# Patient Record
Sex: Female | Born: 1960 | Race: White | Hispanic: No | Marital: Married | Smoking: Never smoker
Health system: Southern US, Community
[De-identification: ages and names within clinical notes are randomized; demographics above are authoritative.]

## PROBLEM LIST (undated history)

## (undated) DIAGNOSIS — J189 Pneumonia, unspecified organism: Secondary | ICD-10-CM

## (undated) DIAGNOSIS — E042 Nontoxic multinodular goiter: Secondary | ICD-10-CM

## (undated) DIAGNOSIS — Z8719 Personal history of other diseases of the digestive system: Secondary | ICD-10-CM

## (undated) DIAGNOSIS — Z9889 Other specified postprocedural states: Secondary | ICD-10-CM

## (undated) DIAGNOSIS — G4730 Sleep apnea, unspecified: Secondary | ICD-10-CM

## (undated) DIAGNOSIS — K219 Gastro-esophageal reflux disease without esophagitis: Secondary | ICD-10-CM

## (undated) HISTORY — PX: BIOPSY THYROID: PRO38

---
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
## 1997-11-12 DIAGNOSIS — I82402 Acute embolism and thrombosis of unspecified deep veins of left lower extremity: Secondary | ICD-10-CM

## 1997-11-12 HISTORY — PX: EXPLORATORY LAPAROTOMY: SUR591

## 1997-11-12 HISTORY — DX: Acute embolism and thrombosis of unspecified deep veins of left lower extremity: I82.402

## 2017-11-12 HISTORY — PX: ESOPHAGOGASTRODUODENOSCOPY: SHX1529

## 2017-11-12 HISTORY — PX: COLONOSCOPY: SHX174

## 2021-01-13 ENCOUNTER — Other Ambulatory Visit: Payer: Self-pay | Admitting: Internal Medicine

## 2021-01-13 DIAGNOSIS — G8929 Other chronic pain: Secondary | ICD-10-CM

## 2021-01-30 ENCOUNTER — Other Ambulatory Visit: Payer: Self-pay

## 2021-01-30 ENCOUNTER — Ambulatory Visit
Admission: RE | Admit: 2021-01-30 | Discharge: 2021-01-30 | Disposition: A | Discharge status: Home / Self Care | Payer: Federal, State, Local not specified - PPO | Source: Ambulatory Visit | Attending: Internal Medicine | Admitting: Internal Medicine

## 2021-01-30 DIAGNOSIS — G8929 Other chronic pain: Secondary | ICD-10-CM | POA: Insufficient documentation

## 2021-01-30 DIAGNOSIS — R109 Unspecified abdominal pain: Secondary | ICD-10-CM | POA: Insufficient documentation

## 2021-01-30 MED ORDER — IOHEXOL 300 MG/ML  SOLN
100.0000 mL | Freq: Once | INTRAMUSCULAR | Status: AC | PRN
  Administered 2021-01-30: 100 mL via INTRAVENOUS

## 2021-07-05 ENCOUNTER — Other Ambulatory Visit: Payer: Self-pay | Admitting: Internal Medicine

## 2021-07-05 DIAGNOSIS — Z1231 Encounter for screening mammogram for malignant neoplasm of breast: Secondary | ICD-10-CM

## 2021-09-04 ENCOUNTER — Ambulatory Visit
Admission: RE | Admit: 2021-09-04 | Discharge: 2021-09-04 | Disposition: A | Discharge status: Home / Self Care | Payer: Federal, State, Local not specified - PPO | Source: Ambulatory Visit | Attending: Internal Medicine | Admitting: Internal Medicine

## 2021-09-04 ENCOUNTER — Other Ambulatory Visit: Payer: Self-pay

## 2021-09-04 DIAGNOSIS — Z1231 Encounter for screening mammogram for malignant neoplasm of breast: Secondary | ICD-10-CM | POA: Insufficient documentation

## 2021-09-11 ENCOUNTER — Other Ambulatory Visit: Payer: Self-pay | Admitting: Internal Medicine

## 2021-09-11 DIAGNOSIS — R928 Other abnormal and inconclusive findings on diagnostic imaging of breast: Secondary | ICD-10-CM

## 2021-09-11 DIAGNOSIS — N6320 Unspecified lump in the left breast, unspecified quadrant: Secondary | ICD-10-CM

## 2021-09-14 ENCOUNTER — Ambulatory Visit
Admission: RE | Admit: 2021-09-14 | Discharge: 2021-09-14 | Disposition: A | Discharge status: Home / Self Care | Payer: Federal, State, Local not specified - PPO | Source: Ambulatory Visit | Attending: Internal Medicine | Admitting: Internal Medicine

## 2021-09-14 ENCOUNTER — Other Ambulatory Visit: Payer: Self-pay

## 2021-09-14 DIAGNOSIS — R928 Other abnormal and inconclusive findings on diagnostic imaging of breast: Secondary | ICD-10-CM

## 2021-09-14 DIAGNOSIS — N6320 Unspecified lump in the left breast, unspecified quadrant: Secondary | ICD-10-CM

## 2021-11-12 HISTORY — PX: ESOPHAGOGASTRODUODENOSCOPY: SHX1529

## 2021-11-23 ENCOUNTER — Ambulatory Visit: Payer: Self-pay | Admitting: General Surgery

## 2021-11-23 NOTE — H&P (Addendum)
PATIENT PROFILE: Laura Compton is a 61 y.o. 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  PCP:  Gladstone Lighter, MD  HISTORY OF PRESENT ILLNESS: Ms. Laura Compton reports she has been having right upper quadrant pain since 3 to 4 years ago.  Patient endorses that the pain is localized to the right upper quadrant.  Pain also related to the epigastric area.  Pain also radiates to the right upper back.  Pain is exacerbated by eating.  There has been no alleviating factors.  Patient endorses that it is exacerbated usually by fatty meals.  Denies any nausea or vomiting.  Denies any issues with bowel movement.  Patient has been evaluated by gastroenterology.  She has had upper endoscopy procedures done without any significant findings.  She has tried multiple antiacid and antireflux medication without any improvement of the pain.  She had an ultrasound done that does not shows cholelithiasis or any abnormal findings.  She also had a HIDA scan that shows feeling of the gallbladder with ejection fraction of 25%.  I personally evaluated the images of the ultrasound and the HIDA scan.   PROBLEM LIST: Problem List  Date Reviewed: 10/23/2021          Noted   GERD (gastroesophageal reflux disease) 11/24/2020   Hx of deep venous thrombosis 11/24/2020   Hyperlipidemia 11/24/2020   Thyroid disease 11/24/2020   Multiple thyroid nodules 11/24/2020   Right flank pain, chronic 11/24/2020   Left foot pain 11/24/2020    GENERAL REVIEW OF SYSTEMS:   General ROS: negative for - chills, fatigue, fever, weight gain or weight loss Allergy and Immunology ROS: negative for - hives  Hematological and Lymphatic ROS: negative for - bleeding problems or bruising, negative for palpable nodes Endocrine ROS: negative for - heat or cold intolerance, hair changes Respiratory ROS: negative for - cough, shortness of breath or wheezing Cardiovascular ROS: no chest  pain or palpitations GI ROS: negative for nausea, vomiting, diarrhea, constipation.  Positive for abdominal pain Musculoskeletal ROS: negative for - joint swelling or muscle pain Neurological ROS: negative for - confusion, syncope Dermatological ROS: negative for pruritus and rash Psychiatric: negative for anxiety, depression, difficulty sleeping and memory loss  MEDICATIONS: Current Outpatient Medications  Medication Sig Dispense Refill   pantoprazole (PROTONIX) 40 MG DR tablet TAKE 1 TABLET BY MOUTH EVERY DAY 90 tablet 1   betamethasone dipropionate (DIPROSONE) 0.05 % ointment Apply topically 2 (two) times daily Use for 5 to 7 days and then as needed. (Patient not taking: Reported on 11/23/2021) 15 g 0   famotidine (PEPCID) 40 MG tablet Take 1 tablet (40 mg total) by mouth at bedtime (Patient not taking: Reported on 11/23/2021) 30 tablet 11   ipratropium (ATROVENT) 0.06 % nasal spray Place 2 sprays into both nostrils 3 (three) times daily as needed for Rhinitis (Patient not taking: Reported on 11/23/2021) 15 mL 0   ondansetron (ZOFRAN-ODT) 4 MG disintegrating tablet Take 1 tablet (4 mg total) by mouth every 8 (eight) hours as needed for Nausea May take TWO at a time IF NEEDED. (Patient not taking: Reported on 11/23/2021) 20 tablet 0   promethazine-dextromethorphan (PROMETHAZINE-DM) 6.25-15 mg/5 mL syrup Take 5 mLs by mouth every 6 (six) hours as needed (Patient not taking: Reported on 11/23/2021) 120 mL 0   sucralfate (CARAFATE) 1 gram tablet  (Patient not taking: Reported on 11/23/2021)     No current facility-administered medications for this visit.  ALLERGIES: °Patient has no known allergies. ° °PAST MEDICAL HISTORY: °Past Medical History:  °Diagnosis Date  ° COVID 06/24/2021  ° GERD (gastroesophageal reflux disease)   ° Hx of deep venous thrombosis   ° Hyperlipidemia   ° Sleep apnea 2019  ° Thyroid disease   ° ° °PAST SURGICAL HISTORY: °Past Surgical History:  °Procedure Laterality Date  °  APPENDECTOMY    ° ESOPHAGOGASTRODOUDENOSCOPY W/ABLATION TUMOR/POLYP/LESION    ° PERCUTANEOUS BIOPSY THYROID    °  ° °FAMILY HISTORY: °Family History  °Problem Relation Age of Onset  ° Dementia Mother   ° Thyroid disease Mother   ° Cancer Mother   ° Alzheimer's disease Mother   ° Heart disease Father   ° Deep vein thrombosis (DVT or abnormal blood clot formation) Father   ° Other Father   °     polio  ° Breast cancer Sister   ° High blood pressure (Hypertension) Brother   ° High blood pressure (Hypertension) Brother   ° Thyroid disease Sister   ° Cancer Sister   ° Thyroid disease Sister   °  ° °SOCIAL HISTORY: °Social History  ° °Socioeconomic History  ° Marital status: Married  °Tobacco Use  ° Smoking status: Never  ° Smokeless tobacco: Never  °Vaping Use  ° Vaping Use: Never used  °Substance and Sexual Activity  ° Alcohol use: Not Currently  °  Comment: maybeonce a month  ° Drug use: Never  ° Sexual activity: Yes  °  Partners: Male  °  Birth control/protection: None  ° ° °PHYSICAL EXAM: °Vitals:  ° 11/23/21 0755  °BP: 119/78  °Pulse: 93  ° °Body mass index is 38.11 kg/m². °Weight: 100.7 kg (222 lb)  ° °GENERAL: Alert, active, oriented x3 ° °HEENT: Pupils equal reactive to light. Extraocular movements are intact. Sclera clear. Palpebral conjunctiva normal red color.Pharynx clear. ° °NECK: Supple with no palpable mass and no adenopathy. ° °LUNGS: Sound clear with no rales rhonchi or wheezes. ° °HEART: Regular rhythm S1 and S2 without murmur. ° °ABDOMEN: Soft and depressible, nontender with no palpable mass, no hepatomegaly.  ° °EXTREMITIES: Well-developed well-nourished symmetrical with no dependent edema. ° °NEUROLOGICAL: Awake alert oriented, facial expression symmetrical, moving all extremities. ° °REVIEW OF DATA: °I have reviewed the following data today: °Office Visit on 10/23/2021  °Component Date Value  ° Glucose 10/23/2021 96   ° Sodium 10/23/2021 135 (L)   ° Potassium 10/23/2021 3.3 (L)   ° Chloride  10/23/2021 95 (L)   ° Carbon Dioxide (CO2) 10/23/2021 32.3 (H)   ° Calcium 10/23/2021 8.6 (L)   ° Urea Nitrogen (BUN) 10/23/2021 15   ° Creatinine 10/23/2021 0.8   ° Glomerular Filtration Ra* 10/23/2021 73   ° BUN/Crea Ratio 10/23/2021 18.8   ° Anion Gap w/K 10/23/2021 11.0   ° WBC (White Blood Cell Co* 10/23/2021 5.6   ° RBC (Red Blood Cell Coun* 10/23/2021 4.88   ° Hemoglobin 10/23/2021 13.8   ° Hematocrit 10/23/2021 40.0   ° MCV (Mean Corpuscular Vo* 10/23/2021 82.0   ° MCH (Mean Corpuscular He* 10/23/2021 28.3   ° MCHC (Mean Corpuscular H* 10/23/2021 34.5   ° Platelet Count 10/23/2021 108 (L)   ° RDW-CV (Red Cell Distrib* 10/23/2021 12.4   ° MPV (Mean Platelet Volum* 10/23/2021 10.6   ° Neutrophils 10/23/2021 3.26   ° Lymphocytes 10/23/2021 1.81   ° Monocytes 10/23/2021 0.42   ° Eosinophils 10/23/2021 0.00   ° Basophils 10/23/2021 0.03   °   Neutrophil % 10/23/2021 58.5   ° Lymphocyte % 10/23/2021 32.4   ° Monocyte % 10/23/2021 7.5   ° Eosinophil % 10/23/2021 0.0 (L)   ° Basophil% 10/23/2021 0.5   ° Immature Granulocyte % 10/23/2021 1.1 (H)   ° Immature Granulocyte Cou* 10/23/2021 0.06   ° Influenza A Antigen 10/23/2021 Negative   ° Influenza B Antigen 10/23/2021 Negative   °Office Visit on 10/17/2021  °Component Date Value  ° Influenza A PCR 10/17/2021 Negative   ° Influenza B PCR 10/17/2021 Negative   ° RSV PCR 10/17/2021 Negative   ° SARS-CoV2 PCR 10/17/2021 Positive (!)   ° WBC (White Blood Cell Co* 10/17/2021 6.3   ° RBC (Red Blood Cell Coun* 10/17/2021 5.37   ° Hemoglobin 10/17/2021 15.3 (H)   ° Hematocrit 10/17/2021 44.8   ° MCV (Mean Corpuscular Vo* 10/17/2021 83.4   ° MCH (Mean Corpuscular He* 10/17/2021 28.5   ° MCHC (Mean Corpuscular H* 10/17/2021 34.2   ° Platelet Count 10/17/2021 175   ° RDW-CV (Red Cell Distrib* 10/17/2021 12.6   ° MPV (Mean Platelet Volum* 10/17/2021 9.8   ° Neutrophils 10/17/2021 5.10   ° Lymphocytes 10/17/2021 0.77 (L)   ° Monocytes 10/17/2021 0.34   ° Eosinophils 10/17/2021 0.06    ° Basophils 10/17/2021 0.02   ° Neutrophil % 10/17/2021 80.9 (H)   ° Lymphocyte % 10/17/2021 12.2   ° Monocyte % 10/17/2021 5.4   ° Eosinophil % 10/17/2021 1.0   ° Basophil% 10/17/2021 0.3   ° Immature Granulocyte % 10/17/2021 0.2   ° Immature Granulocyte Cou* 10/17/2021 0.01   ° Glucose 10/17/2021 112 (H)   ° Sodium 10/17/2021 134 (L)   ° Potassium 10/17/2021 3.7   ° Chloride 10/17/2021 100   ° Carbon Dioxide (CO2) 10/17/2021 27.3   ° Urea Nitrogen (BUN) 10/17/2021 10   ° Creatinine 10/17/2021 0.8   ° Glomerular Filtration Ra* 10/17/2021 73   ° Calcium 10/17/2021 9.2   ° AST  10/17/2021 26   ° ALT  10/17/2021 28   ° Alk Phos (alkaline Phosp* 10/17/2021 68   ° Albumin 10/17/2021 4.3   ° Bilirubin, Total 10/17/2021 0.8   ° Protein, Total 10/17/2021 7.4   ° A/G Ratio 10/17/2021 1.4   ° Amylase 10/17/2021 44   ° Lipase 10/17/2021 29   °  ° °ASSESSMENT: °Ms. Laura Compton is a 60 y.o. female presenting for consultation for biliary dyskinesia.   ° °Patient was oriented about the diagnosis of biliary dyskinesia. Also oriented about what is the gallbladder, its anatomy and function and the implications of gallbladder low ejection fraction. The patient was oriented about the treatment alternatives (observation vs cholecystectomy). Patient was oriented that a low percentage of patient will continue to have similar pain symptoms even after the gallbladder is removed. Surgical technique (open vs laparoscopic) was discussed. It was also discussed the goals of the surgery (decrease the pain episodes and avoid the risk of cholecystitis) and the risk of surgery including: bleeding, infection, common bile duct injury, stone retention, injury to other organs such as bowel, liver, stomach, other complications such as hernia, bowel obstruction among others. Also discussed with patient about anesthesia and its complications such as: reaction to medications, pneumonia, heart complications, death, among others.  ° °Since patient had classic  biliary colic's I considered that if cholecystectomy should benefit the patient.  She understand that there is a low risk of not improving the pain. 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  Robotic   assisted laparoscopic cholecystectomy (81275) 2.  CBC, CMP (Done) 3.  Do not take aspirin 5 days before the procedure 4.  Contact us if has any question or concern.   Patient and her husband verbalized understanding, all questions were answered, and were agreeable with the plan outlined above.    [name], MD  Electronically signed by [name], MD

## 2021-11-23 NOTE — H&P (View-Only) (Signed)
PATIENT PROFILE: Kalani Baray is a 61 y.o. 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  PCP:  Gladstone Lighter, MD  HISTORY OF PRESENT ILLNESS: Ms. Dominique reports she has been having right upper quadrant pain since 3 to 4 years ago.  Patient endorses that the pain is localized to the right upper quadrant.  Pain also related to the epigastric area.  Pain also radiates to the right upper back.  Pain is exacerbated by eating.  There has been no alleviating factors.  Patient endorses that it is exacerbated usually by fatty meals.  Denies any nausea or vomiting.  Denies any issues with bowel movement.  Patient has been evaluated by gastroenterology.  She has had upper endoscopy procedures done without any significant findings.  She has tried multiple antiacid and antireflux medication without any improvement of the pain.  She had an ultrasound done that does not shows cholelithiasis or any abnormal findings.  She also had a HIDA scan that shows feeling of the gallbladder with ejection fraction of 25%.  I personally evaluated the images of the ultrasound and the HIDA scan.   PROBLEM LIST: Problem List  Date Reviewed: 10/23/2021          Noted   GERD (gastroesophageal reflux disease) 11/24/2020   Hx of deep venous thrombosis 11/24/2020   Hyperlipidemia 11/24/2020   Thyroid disease 11/24/2020   Multiple thyroid nodules 11/24/2020   Right flank pain, chronic 11/24/2020   Left foot pain 11/24/2020    GENERAL REVIEW OF SYSTEMS:   General ROS: negative for - chills, fatigue, fever, weight gain or weight loss Allergy and Immunology ROS: negative for - hives  Hematological and Lymphatic ROS: negative for - bleeding problems or bruising, negative for palpable nodes Endocrine ROS: negative for - heat or cold intolerance, hair changes Respiratory ROS: negative for - cough, shortness of breath or wheezing Cardiovascular ROS: no chest  pain or palpitations GI ROS: negative for nausea, vomiting, diarrhea, constipation.  Positive for abdominal pain Musculoskeletal ROS: negative for - joint swelling or muscle pain Neurological ROS: negative for - confusion, syncope Dermatological ROS: negative for pruritus and rash Psychiatric: negative for anxiety, depression, difficulty sleeping and memory loss  MEDICATIONS: Current Outpatient Medications  Medication Sig Dispense Refill   pantoprazole (PROTONIX) 40 MG DR tablet TAKE 1 TABLET BY MOUTH EVERY DAY 90 tablet 1   betamethasone dipropionate (DIPROSONE) 0.05 % ointment Apply topically 2 (two) times daily Use for 5 to 7 days and then as needed. (Patient not taking: Reported on 11/23/2021) 15 g 0   famotidine (PEPCID) 40 MG tablet Take 1 tablet (40 mg total) by mouth at bedtime (Patient not taking: Reported on 11/23/2021) 30 tablet 11   ipratropium (ATROVENT) 0.06 % nasal spray Place 2 sprays into both nostrils 3 (three) times daily as needed for Rhinitis (Patient not taking: Reported on 11/23/2021) 15 mL 0   ondansetron (ZOFRAN-ODT) 4 MG disintegrating tablet Take 1 tablet (4 mg total) by mouth every 8 (eight) hours as needed for Nausea May take TWO at a time IF NEEDED. (Patient not taking: Reported on 11/23/2021) 20 tablet 0   promethazine-dextromethorphan (PROMETHAZINE-DM) 6.25-15 mg/5 mL syrup Take 5 mLs by mouth every 6 (six) hours as needed (Patient not taking: Reported on 11/23/2021) 120 mL 0   sucralfate (CARAFATE) 1 gram tablet  (Patient not taking: Reported on 11/23/2021)     No current facility-administered medications for this visit.  ALLERGIES: Patient has no known allergies.  PAST MEDICAL HISTORY: Past Medical History:  Diagnosis Date   COVID 06/24/2021   GERD (gastroesophageal reflux disease)    Hx of deep venous thrombosis    Hyperlipidemia    Sleep apnea 2019   Thyroid disease     PAST SURGICAL HISTORY: Past Surgical History:  Procedure Laterality Date    APPENDECTOMY     ESOPHAGOGASTRODOUDENOSCOPY W/ABLATION TUMOR/POLYP/LESION     PERCUTANEOUS BIOPSY THYROID       FAMILY HISTORY: Family History  Problem Relation Age of Onset   Dementia Mother    Thyroid disease Mother    Cancer Mother    Alzheimer's disease Mother    Heart disease Father    Deep vein thrombosis (DVT or abnormal blood clot formation) Father    Other Father        polio   Breast cancer Sister    High blood pressure (Hypertension) Brother    High blood pressure (Hypertension) Brother    Thyroid disease Sister    Cancer Sister    Thyroid disease Sister      SOCIAL HISTORY: Social History   Socioeconomic History   Marital status: Married  Tobacco Use   Smoking status: Never   Smokeless tobacco: Never  Vaping Use   Vaping Use: Never used  Substance and Sexual Activity   Alcohol use: Not Currently    Comment: maybeonce a month   Drug use: Never   Sexual activity: Yes    Partners: Male    Birth control/protection: None    PHYSICAL EXAM: Vitals:   11/23/21 0755  BP: 119/78  Pulse: 93   Body mass index is 38.11 kg/m. Weight: 100.7 kg (222 lb)   GENERAL: Alert, active, oriented x3  HEENT: Pupils equal reactive to light. Extraocular movements are intact. Sclera clear. Palpebral conjunctiva normal red color.Pharynx clear.  NECK: Supple with no palpable mass and no adenopathy.  LUNGS: Sound clear with no rales rhonchi or wheezes.  HEART: Regular rhythm S1 and S2 without murmur.  ABDOMEN: Soft and depressible, nontender with no palpable mass, no hepatomegaly.   EXTREMITIES: Well-developed well-nourished symmetrical with no dependent edema.  NEUROLOGICAL: Awake alert oriented, facial expression symmetrical, moving all extremities.  REVIEW OF DATA: I have reviewed the following data today: Office Visit on 10/23/2021  Component Date Value   Glucose 10/23/2021 96    Sodium 10/23/2021 135 (L)    Potassium 10/23/2021 3.3 (L)    Chloride  10/23/2021 95 (L)    Carbon Dioxide (CO2) 10/23/2021 32.3 (H)    Calcium 10/23/2021 8.6 (L)    Urea Nitrogen (BUN) 10/23/2021 15    Creatinine 10/23/2021 0.8    Glomerular Filtration Ra* 10/23/2021 73    BUN/Crea Ratio 10/23/2021 18.8    Anion Gap w/K 10/23/2021 11.0    WBC (White Blood Cell Co* 10/23/2021 5.6    RBC (Red Blood Cell Coun* 10/23/2021 4.88    Hemoglobin 10/23/2021 13.8    Hematocrit 10/23/2021 40.0    MCV (Mean Corpuscular Vo* 10/23/2021 82.0    MCH (Mean Corpuscular He* 10/23/2021 28.3    MCHC (Mean Corpuscular H* 10/23/2021 34.5    Platelet Count 10/23/2021 108 (L)    RDW-CV (Red Cell Distrib* 10/23/2021 12.4    MPV (Mean Platelet Volum* 10/23/2021 10.6    Neutrophils 10/23/2021 3.26    Lymphocytes 10/23/2021 1.81    Monocytes 10/23/2021 0.42    Eosinophils 10/23/2021 0.00    Basophils 10/23/2021 0.03  Neutrophil % 10/23/2021 58.5   ° Lymphocyte % 10/23/2021 32.4   ° Monocyte % 10/23/2021 7.5   ° Eosinophil % 10/23/2021 0.0 (L)   ° Basophil% 10/23/2021 0.5   ° Immature Granulocyte % 10/23/2021 1.1 (H)   ° Immature Granulocyte Cou* 10/23/2021 0.06   ° Influenza A Antigen 10/23/2021 Negative   ° Influenza B Antigen 10/23/2021 Negative   °Office Visit on 10/17/2021  °Component Date Value  ° Influenza A PCR 10/17/2021 Negative   ° Influenza B PCR 10/17/2021 Negative   ° RSV PCR 10/17/2021 Negative   ° SARS-CoV2 PCR 10/17/2021 Positive (!)   ° WBC (White Blood Cell Co* 10/17/2021 6.3   ° RBC (Red Blood Cell Coun* 10/17/2021 5.37   ° Hemoglobin 10/17/2021 15.3 (H)   ° Hematocrit 10/17/2021 44.8   ° MCV (Mean Corpuscular Vo* 10/17/2021 83.4   ° MCH (Mean Corpuscular He* 10/17/2021 28.5   ° MCHC (Mean Corpuscular H* 10/17/2021 34.2   ° Platelet Count 10/17/2021 175   ° RDW-CV (Red Cell Distrib* 10/17/2021 12.6   ° MPV (Mean Platelet Volum* 10/17/2021 9.8   ° Neutrophils 10/17/2021 5.10   ° Lymphocytes 10/17/2021 0.77 (L)   ° Monocytes 10/17/2021 0.34   ° Eosinophils 10/17/2021 0.06    ° Basophils 10/17/2021 0.02   ° Neutrophil % 10/17/2021 80.9 (H)   ° Lymphocyte % 10/17/2021 12.2   ° Monocyte % 10/17/2021 5.4   ° Eosinophil % 10/17/2021 1.0   ° Basophil% 10/17/2021 0.3   ° Immature Granulocyte % 10/17/2021 0.2   ° Immature Granulocyte Cou* 10/17/2021 0.01   ° Glucose 10/17/2021 112 (H)   ° Sodium 10/17/2021 134 (L)   ° Potassium 10/17/2021 3.7   ° Chloride 10/17/2021 100   ° Carbon Dioxide (CO2) 10/17/2021 27.3   ° Urea Nitrogen (BUN) 10/17/2021 10   ° Creatinine 10/17/2021 0.8   ° Glomerular Filtration Ra* 10/17/2021 73   ° Calcium 10/17/2021 9.2   ° AST  10/17/2021 26   ° ALT  10/17/2021 28   ° Alk Phos (alkaline Phosp* 10/17/2021 68   ° Albumin 10/17/2021 4.3   ° Bilirubin, Total 10/17/2021 0.8   ° Protein, Total 10/17/2021 7.4   ° A/G Ratio 10/17/2021 1.4   ° Amylase 10/17/2021 44   ° Lipase 10/17/2021 29   °  ° °ASSESSMENT: °Ms. Gilson is a 60 y.o. female presenting for consultation for biliary dyskinesia.   ° °Patient was oriented about the diagnosis of biliary dyskinesia. Also oriented about what is the gallbladder, its anatomy and function and the implications of gallbladder low ejection fraction. The patient was oriented about the treatment alternatives (observation vs cholecystectomy). Patient was oriented that a low percentage of patient will continue to have similar pain symptoms even after the gallbladder is removed. Surgical technique (open vs laparoscopic) was discussed. It was also discussed the goals of the surgery (decrease the pain episodes and avoid the risk of cholecystitis) and the risk of surgery including: bleeding, infection, common bile duct injury, stone retention, injury to other organs such as bowel, liver, stomach, other complications such as hernia, bowel obstruction among others. Also discussed with patient about anesthesia and its complications such as: reaction to medications, pneumonia, heart complications, death, among others.  ° °Since patient had classic  biliary colic's I considered that if cholecystectomy should benefit the patient.  She understand that there is a low risk of not improving the pain. 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  Robotic   assisted laparoscopic cholecystectomy (81275) 2.  CBC, CMP (Done) 3.  Do not take aspirin 5 days before the procedure 4.  Contact us if has any question or concern.   Patient and her husband verbalized understanding, all questions were answered, and were agreeable with the plan outlined above.    [name], MD  Electronically signed by [name], MD

## 2021-11-30 ENCOUNTER — Encounter
Admission: RE | Admit: 2021-11-30 | Discharge: 2021-11-30 | Disposition: A | Discharge status: Home / Self Care | Payer: Federal, State, Local not specified - PPO | Source: Ambulatory Visit | Attending: General Surgery | Admitting: General Surgery

## 2021-11-30 ENCOUNTER — Other Ambulatory Visit: Payer: Self-pay

## 2021-11-30 VITALS — Ht 65.0 in | Wt 220.0 lb

## 2021-11-30 DIAGNOSIS — Z01812 Encounter for preprocedural laboratory examination: Secondary | ICD-10-CM

## 2021-11-30 HISTORY — DX: Nontoxic multinodular goiter: E04.2

## 2021-11-30 HISTORY — DX: Gastro-esophageal reflux disease without esophagitis: K21.9

## 2021-11-30 HISTORY — DX: Pneumonia, unspecified organism: J18.9

## 2021-11-30 HISTORY — DX: Personal history of other diseases of the digestive system: Z87.19

## 2021-11-30 HISTORY — DX: Sleep apnea, unspecified: G47.30

## 2021-11-30 NOTE — Patient Instructions (Addendum)
Your procedure is scheduled on: Wednesday, January 25 Report to the Registration Desk on the 1st floor of the [facility]. To find out your arrival time, please call [phone] between 1PM - 3PM on: Tuesday, January 24  REMEMBER: Instructions that are not followed completely may result in serious medical risk, up to and including death; or upon the discretion of your surgeon and anesthesiologist your surgery may need to be rescheduled.  Do not eat food after midnight the night before surgery.  No gum chewing, lozengers or hard candies.  You may however, drink water up to 2 hours before you are scheduled to arrive for your surgery. Do not drink anything within 2 hours of your scheduled arrival time.  TAKE THESE MEDICATIONS THE MORNING OF SURGERY WITH A SIP OF WATER:  Pantoprazole (Protonix)  One week prior to surgery: starting January 18 Stop Anti-inflammatories (NSAIDS) such as Advil, Aleve, Ibuprofen, Motrin, Naproxen, Naprosyn and Aspirin based products such as Excedrin, Goodys Powder, BC Powder. Stop ANY OVER THE COUNTER supplements until after surgery. You may however, continue to take Tylenol if needed for pain up until the day of surgery.  No Alcohol for 24 hours before or after surgery.  No Smoking including e-cigarettes for 24 hours prior to surgery.  No chewable tobacco products for at least 6 hours prior to surgery.  No nicotine patches on the day of surgery.  Do not use any "recreational" drugs for at least a week prior to your surgery.  Please be advised that the combination of cocaine and anesthesia may have negative outcomes, up to and including death. If you test positive for cocaine, your surgery will be cancelled.  On the morning of surgery brush your teeth with toothpaste and water, you may rinse your mouth with mouthwash if you wish. Do not swallow any toothpaste or mouthwash.  Use CHG Soap as directed on instruction sheet.  Do not wear jewelry, make-up,  hairpins, clips or nail polish.  Do not wear lotions, powders, or perfumes.   Do not shave body from the neck down 48 hours prior to surgery just in case you cut yourself which could leave a site for infection.  Also, freshly shaved skin may become irritated if using the CHG soap.  Contact lenses, hearing aids and dentures may not be worn into surgery.  Do not bring valuables to the hospital. [facility] is not responsible for any missing/lost belongings or valuables.   Notify your doctor if there is any change in your medical condition (cold, fever, infection).  Wear comfortable clothing (specific to your surgery type) to the hospital.  After surgery, you can help prevent lung complications by doing breathing exercises.  Take deep breaths and cough every 1-2 hours. Your doctor may order a device called an Incentive Spirometer to help you take deep breaths. When coughing or sneezing, hold a pillow firmly against your incision with both hands. This is called splinting. Doing this helps protect your incision. It also decreases belly discomfort.  If you are being discharged the day of surgery, you will not be allowed to drive home. You will need a responsible adult (18 years or older) to drive you home and stay with you that night.   If you are taking public transportation, you will need to have a responsible adult (18 years or older) with you. Please confirm with your physician that it is acceptable to use public transportation.   Please call the [facility] at [phone] if  you have any questions about these instructions.  Surgery Visitation Policy:  Patients undergoing a surgery or procedure may have one family member or support person with them as long as that person is not COVID-19 positive or experiencing its symptoms.  That person may remain in the waiting area during the procedure and may rotate out with other people.

## 2021-12-01 ENCOUNTER — Encounter: Payer: Self-pay | Admitting: Urgent Care

## 2021-12-01 ENCOUNTER — Encounter
Admission: RE | Admit: 2021-12-01 | Discharge: 2021-12-01 | Disposition: A | Discharge status: Home / Self Care | Payer: Federal, State, Local not specified - PPO | Source: Ambulatory Visit | Attending: General Surgery | Admitting: General Surgery

## 2021-12-01 DIAGNOSIS — Z01812 Encounter for preprocedural laboratory examination: Secondary | ICD-10-CM | POA: Diagnosis present

## 2021-12-01 LAB — POTASSIUM: Potassium: 3.8 mmol/L (ref 3.5–5.1)

## 2021-12-06 ENCOUNTER — Other Ambulatory Visit: Payer: Self-pay

## 2021-12-06 ENCOUNTER — Ambulatory Visit: Payer: Federal, State, Local not specified - PPO | Admitting: Anesthesiology

## 2021-12-06 ENCOUNTER — Encounter: Payer: Self-pay | Admitting: General Surgery

## 2021-12-06 ENCOUNTER — Ambulatory Visit
Admission: RE | Admit: 2021-12-06 | Discharge: 2021-12-06 | Disposition: A | Discharge status: Home / Self Care | Payer: Federal, State, Local not specified - PPO | Attending: General Surgery | Admitting: General Surgery

## 2021-12-06 ENCOUNTER — Encounter
Admission: RE | Disposition: A | Discharge status: Home / Self Care | Payer: Self-pay | Source: Home / Self Care | Attending: General Surgery

## 2021-12-06 DIAGNOSIS — Z9889 Other specified postprocedural states: Secondary | ICD-10-CM

## 2021-12-06 DIAGNOSIS — K219 Gastro-esophageal reflux disease without esophagitis: Secondary | ICD-10-CM | POA: Diagnosis not present

## 2021-12-06 DIAGNOSIS — K828 Other specified diseases of gallbladder: Secondary | ICD-10-CM | POA: Diagnosis not present

## 2021-12-06 DIAGNOSIS — K811 Chronic cholecystitis: Secondary | ICD-10-CM | POA: Insufficient documentation

## 2021-12-06 DIAGNOSIS — G4730 Sleep apnea, unspecified: Secondary | ICD-10-CM | POA: Insufficient documentation

## 2021-12-06 DIAGNOSIS — K449 Diaphragmatic hernia without obstruction or gangrene: Secondary | ICD-10-CM | POA: Diagnosis not present

## 2021-12-06 DIAGNOSIS — R112 Nausea with vomiting, unspecified: Secondary | ICD-10-CM

## 2021-12-06 HISTORY — DX: Other specified postprocedural states: Z98.890

## 2021-12-06 SURGERY — CHOLECYSTECTOMY, ROBOT-ASSISTED, LAPAROSCOPIC
Anesthesia: General | Site: Abdomen

## 2021-12-06 MED ORDER — PROPOFOL 500 MG/50ML IV EMUL
INTRAVENOUS | Status: AC
  Filled 2021-12-06: qty 50

## 2021-12-06 MED ORDER — ROCURONIUM BROMIDE 100 MG/10ML IV SOLN
INTRAVENOUS | Status: DC | PRN
Start: 2021-12-06 — End: 2021-12-06
  Administered 2021-12-06: 50 mg via INTRAVENOUS

## 2021-12-06 MED ORDER — DEXAMETHASONE SODIUM PHOSPHATE 10 MG/ML IJ SOLN
INTRAMUSCULAR | Status: AC
  Filled 2021-12-06: qty 1

## 2021-12-06 MED ORDER — BUPIVACAINE-EPINEPHRINE 0.25% -1:200000 IJ SOLN
INTRAMUSCULAR | Status: DC | PRN
  Administered 2021-12-06: 30 mL

## 2021-12-06 MED ORDER — SUGAMMADEX SODIUM 200 MG/2ML IV SOLN
INTRAVENOUS | Status: DC | PRN
  Administered 2021-12-06: 200 mg via INTRAVENOUS

## 2021-12-06 MED ORDER — ACETAMINOPHEN 10 MG/ML IV SOLN: 1000.0000 mg | Freq: Once | INTRAVENOUS | Status: DC | PRN

## 2021-12-06 MED ORDER — INDOCYANINE GREEN 25 MG IV SOLR
1.2500 mg | Freq: Once | INTRAVENOUS | Status: AC
  Administered 2021-12-06: 07:00:00 1.25 mg via INTRAVENOUS
  Filled 2021-12-06: qty 0.5

## 2021-12-06 MED ORDER — FENTANYL CITRATE (PF) 100 MCG/2ML IJ SOLN
INTRAMUSCULAR | Status: AC
  Filled 2021-12-06: qty 2

## 2021-12-06 MED ORDER — OXYCODONE HCL 5 MG PO TABS
ORAL_TABLET | ORAL | Status: AC
  Filled 2021-12-06: qty 1

## 2021-12-06 MED ORDER — CEFAZOLIN SODIUM-DEXTROSE 2-4 GM/100ML-% IV SOLN
2.0000 g | INTRAVENOUS | Status: AC
  Administered 2021-12-06: 08:00:00 2 g via INTRAVENOUS

## 2021-12-06 MED ORDER — FENTANYL CITRATE (PF) 100 MCG/2ML IJ SOLN
INTRAMUSCULAR | Status: DC | PRN
  Administered 2021-12-06 (×2): 50 ug via INTRAVENOUS

## 2021-12-06 MED ORDER — ORAL CARE MOUTH RINSE: 15.0000 mL | Freq: Once | OROMUCOSAL | Status: AC

## 2021-12-06 MED ORDER — OXYCODONE HCL 5 MG/5ML PO SOLN: 5.0000 mg | Freq: Once | ORAL | Status: AC | PRN

## 2021-12-06 MED ORDER — DEXMEDETOMIDINE (PRECEDEX) IN NS 20 MCG/5ML (4 MCG/ML) IV SYRINGE
PREFILLED_SYRINGE | INTRAVENOUS | Status: AC
  Filled 2021-12-06: qty 5

## 2021-12-06 MED ORDER — CEFAZOLIN SODIUM-DEXTROSE 2-4 GM/100ML-% IV SOLN
INTRAVENOUS | Status: AC
  Filled 2021-12-06: qty 100

## 2021-12-06 MED ORDER — DEXAMETHASONE SODIUM PHOSPHATE 10 MG/ML IJ SOLN
INTRAMUSCULAR | Status: DC | PRN
  Administered 2021-12-06: 10 mg via INTRAVENOUS

## 2021-12-06 MED ORDER — DEXMEDETOMIDINE (PRECEDEX) IN NS 20 MCG/5ML (4 MCG/ML) IV SYRINGE
PREFILLED_SYRINGE | INTRAVENOUS | Status: DC | PRN
  Administered 2021-12-06: 12 ug via INTRAVENOUS

## 2021-12-06 MED ORDER — FENTANYL CITRATE (PF) 100 MCG/2ML IJ SOLN
25.0000 ug | INTRAMUSCULAR | Status: DC | PRN
  Administered 2021-12-06: 09:00:00 25 ug via INTRAVENOUS

## 2021-12-06 MED ORDER — CHLORHEXIDINE GLUCONATE 0.12 % MT SOLN
OROMUCOSAL | Status: AC
  Administered 2021-12-06: 07:00:00 15 mL via OROMUCOSAL
  Filled 2021-12-06: qty 15

## 2021-12-06 MED ORDER — OXYCODONE HCL 5 MG PO TABS
5.0000 mg | ORAL_TABLET | Freq: Once | ORAL | Status: AC | PRN
  Administered 2021-12-06: 10:00:00 5 mg via ORAL

## 2021-12-06 MED ORDER — BUPIVACAINE-EPINEPHRINE (PF) 0.25% -1:200000 IJ SOLN
INTRAMUSCULAR | Status: AC
  Filled 2021-12-06: qty 30

## 2021-12-06 MED ORDER — MIDAZOLAM HCL 2 MG/2ML IJ SOLN
INTRAMUSCULAR | Status: DC | PRN
Start: 2021-12-06 — End: 2021-12-06
  Administered 2021-12-06: 2 mg via INTRAVENOUS

## 2021-12-06 MED ORDER — PROPOFOL 10 MG/ML IV BOLUS
INTRAVENOUS | Status: DC | PRN
Start: 2021-12-06 — End: 2021-12-06
  Administered 2021-12-06: 200 mg via INTRAVENOUS

## 2021-12-06 MED ORDER — PROMETHAZINE HCL 25 MG/ML IJ SOLN
INTRAMUSCULAR | Status: AC
  Filled 2021-12-06: qty 1

## 2021-12-06 MED ORDER — ROCURONIUM BROMIDE 10 MG/ML (PF) SYRINGE
PREFILLED_SYRINGE | INTRAVENOUS | Status: AC
  Filled 2021-12-06: qty 10

## 2021-12-06 MED ORDER — CHLORHEXIDINE GLUCONATE 0.12 % MT SOLN: 15.0000 mL | Freq: Once | OROMUCOSAL | Status: AC

## 2021-12-06 MED ORDER — LACTATED RINGERS IV SOLN: INTRAVENOUS | Status: DC

## 2021-12-06 MED ORDER — ACETAMINOPHEN 10 MG/ML IV SOLN
INTRAVENOUS | Status: AC
  Filled 2021-12-06: qty 100

## 2021-12-06 MED ORDER — ACETAMINOPHEN 10 MG/ML IV SOLN
INTRAVENOUS | Status: DC | PRN
  Administered 2021-12-06: 1000 mg via INTRAVENOUS

## 2021-12-06 MED ORDER — PROMETHAZINE HCL 25 MG/ML IJ SOLN
12.5000 mg | Freq: Once | INTRAMUSCULAR | Status: AC
  Administered 2021-12-06: 09:00:00 12.5 mg via INTRAVENOUS

## 2021-12-06 MED ORDER — MIDAZOLAM HCL 2 MG/2ML IJ SOLN
INTRAMUSCULAR | Status: AC
  Filled 2021-12-06: qty 2

## 2021-12-06 MED ORDER — ONDANSETRON HCL 4 MG/2ML IJ SOLN
INTRAMUSCULAR | Status: AC
  Filled 2021-12-06: qty 2

## 2021-12-06 MED ORDER — HYDROCODONE-ACETAMINOPHEN 5-325 MG PO TABS: 1.0000 | ORAL_TABLET | ORAL | 0 refills | Status: AC | PRN

## 2021-12-06 MED ORDER — LIDOCAINE HCL (CARDIAC) PF 100 MG/5ML IV SOSY
PREFILLED_SYRINGE | INTRAVENOUS | Status: DC | PRN
  Administered 2021-12-06: 100 mg via INTRAVENOUS

## 2021-12-06 MED ORDER — ONDANSETRON HCL 4 MG/2ML IJ SOLN
4.0000 mg | Freq: Once | INTRAMUSCULAR | Status: AC | PRN
  Administered 2021-12-06: 09:00:00 4 mg via INTRAVENOUS

## 2021-12-06 MED ORDER — LIDOCAINE HCL (PF) 2 % IJ SOLN
INTRAMUSCULAR | Status: AC
  Filled 2021-12-06: qty 5

## 2021-12-06 MED ORDER — ONDANSETRON HCL 4 MG/2ML IJ SOLN
INTRAMUSCULAR | Status: DC | PRN
  Administered 2021-12-06: 4 mg via INTRAVENOUS

## 2021-12-06 SURGICAL SUPPLY — 49 items
BAG INFUSER PRESSURE 100CC (MISCELLANEOUS) IMPLANT
BLADE SURG SZ11 CARB STEEL (BLADE) ×3 IMPLANT
CANNULA REDUC XI 12-8 STAPL (CANNULA) ×1
CANNULA REDUCER 12-8 DVNC XI (CANNULA) ×2 IMPLANT
CLIP LIGATING HEMO O LOK GREEN (MISCELLANEOUS) ×3 IMPLANT
DECANTER SPIKE VIAL GLASS SM (MISCELLANEOUS) ×5 IMPLANT
DERMABOND ADVANCED (GAUZE/BANDAGES/DRESSINGS) ×1
DERMABOND ADVANCED .7 DNX12 (GAUZE/BANDAGES/DRESSINGS) ×2 IMPLANT
DRAPE ARM DVNC X/XI (DISPOSABLE) ×8 IMPLANT
DRAPE COLUMN DVNC XI (DISPOSABLE) ×2 IMPLANT
DRAPE DA VINCI XI ARM (DISPOSABLE) ×4
DRAPE DA VINCI XI COLUMN (DISPOSABLE) ×1
ELECT REM PT RETURN 9FT ADLT (ELECTROSURGICAL) ×3
ELECTRODE REM PT RTRN 9FT ADLT (ELECTROSURGICAL) ×2 IMPLANT
GLOVE SURG ENC MOIS LTX SZ6.5 (GLOVE) ×7 IMPLANT
GLOVE SURG UNDER POLY LF SZ6.5 (GLOVE) ×7 IMPLANT
GOWN STRL REUS W/ TWL LRG LVL3 (GOWN DISPOSABLE) ×6 IMPLANT
GOWN STRL REUS W/TWL LRG LVL3 (GOWN DISPOSABLE) ×3
GRASPER SUT TROCAR 14GX15 (MISCELLANEOUS) ×3 IMPLANT
IRRIGATOR SUCT 8 DISP DVNC XI (IRRIGATION / IRRIGATOR) IMPLANT
IRRIGATOR SUCTION 8MM XI DISP (IRRIGATION / IRRIGATOR)
IV NS 1000ML (IV SOLUTION)
IV NS 1000ML BAXH (IV SOLUTION) IMPLANT
KIT PINK PAD W/HEAD ARE REST (MISCELLANEOUS) ×3
KIT PINK PAD W/HEAD ARM REST (MISCELLANEOUS) ×2 IMPLANT
LABEL OR SOLS (LABEL) ×3 IMPLANT
MANIFOLD NEPTUNE II (INSTRUMENTS) ×3 IMPLANT
NDL INSUFFLATION 14GA 120MM (NEEDLE) ×2 IMPLANT
NEEDLE HYPO 22GX1.5 SAFETY (NEEDLE) ×3 IMPLANT
NEEDLE INSUFFLATION 14GA 120MM (NEEDLE) ×3 IMPLANT
NS IRRIG 500ML POUR BTL (IV SOLUTION) ×3 IMPLANT
OBTURATOR OPTICAL STANDARD 8MM (TROCAR) ×1
OBTURATOR OPTICAL STND 8 DVNC (TROCAR) ×2
OBTURATOR OPTICALSTD 8 DVNC (TROCAR) ×2 IMPLANT
PACK LAP CHOLECYSTECTOMY (MISCELLANEOUS) ×3 IMPLANT
POUCH ENDO CATCH 10MM SPEC (MISCELLANEOUS) ×1 IMPLANT
POUCH SPECIMEN RETRIEVAL 10MM (ENDOMECHANICALS) ×2 IMPLANT
SEAL CANN UNIV 5-8 DVNC XI (MISCELLANEOUS) ×6 IMPLANT
SEAL XI 5MM-8MM UNIVERSAL (MISCELLANEOUS) ×3
SET TUBE SMOKE EVAC HIGH FLOW (TUBING) ×3 IMPLANT
SOLUTION ELECTROLUBE (MISCELLANEOUS) ×3 IMPLANT
SPONGE T-LAP 4X18 ~~LOC~~+RFID (SPONGE) IMPLANT
STAPLER CANNULA SEAL DVNC XI (STAPLE) ×2 IMPLANT
STAPLER CANNULA SEAL XI (STAPLE) ×1
SUT MNCRL 4-0 (SUTURE) ×1
SUT MNCRL 4-0 27XMFL (SUTURE) ×2
SUT VICRYL 0 AB UR-6 (SUTURE) ×3 IMPLANT
SUTURE MNCRL 4-0 27XMF (SUTURE) ×2 IMPLANT
WATER STERILE IRR 500ML POUR (IV SOLUTION) ×2 IMPLANT

## 2021-12-06 NOTE — Transfer of Care (Signed)
Immediate Anesthesia Transfer of Care Note  Patient: Laura Compton  Procedure(s) Performed: XI ROBOTIC ASSISTED LAPAROSCOPIC CHOLECYSTECTOMY (Abdomen) INDOCYANINE GREEN FLUORESCENCE IMAGING (ICG)  Patient Location: PACU  Anesthesia Type:General  Level of Consciousness: drowsy  Airway & Oxygen Therapy: Patient Spontanous Breathing and Patient connected to nasal cannula oxygen  Post-op Assessment: Report given to RN and Post -op Vital signs reviewed and stable  Post vital signs: Reviewed and stable  Last Vitals:  Vitals Value Taken Time  BP 162/84 12/06/21 0851  Temp    Pulse 92 12/06/21 0854  Resp 12 12/06/21 0854  SpO2 100 % 12/06/21 0854  Vitals shown include unvalidated device data.  Last Pain:  Vitals:   12/06/21 0631  TempSrc: Temporal  PainSc: 0-No pain      Patients Stated Pain Goal: 0 (12/06/21 0631)  Complications: No notable events documented.

## 2021-12-06 NOTE — Addendum Note (Signed)
Addendum  created 12/06/21 1022 by Irving Burton, CRNA   Problem List modified

## 2021-12-06 NOTE — Discharge Instructions (Addendum)
  Diet: Resume home heart healthy regular diet.   Activity: No heavy lifting >20 pounds (children, pets, laundry, garbage) or strenuous activity until follow-up, but light activity and walking are encouraged. Do not drive or drink alcohol if taking narcotic pain medications.  Wound care: May shower with soapy water and pat dry (do not rub incisions), but no baths or submerging incision underwater until follow-up. (no swimming)   Medications: Resume all home medications. For mild to moderate pain: acetaminophen (Tylenol) ***or ibuprofen (if no kidney disease). Combining Tylenol with alcohol can substantially increase your risk of causing liver disease. Narcotic pain medications, if prescribed, can be used for severe pain, though may cause nausea, constipation, and drowsiness. Do not combine Tylenol and Norco within a 6 hour period as Norco contains Tylenol. If you do not need the narcotic pain medication, you do not need to fill the prescription.  Call office ([phone]) at any time if any questions, worsening pain, fevers/chills, bleeding, drainage from incision site, or other concerns.   AMBULATORY SURGERY  DISCHARGE INSTRUCTIONS   The drugs that you were given will stay in your system until tomorrow so for the next 24 hours you should not:  Drive an automobile Make any legal decisions Drink any alcoholic beverage   You may resume regular meals tomorrow.  Today it is better to start with liquids and gradually work up to solid foods.  You may eat anything you prefer, but it is better to start with liquids, then soup and crackers, and gradually work up to solid foods.   Please notify your doctor immediately if you have any unusual bleeding, trouble breathing, redness and pain at the surgery site, drainage, fever, or pain not relieved by medication.    Additional Instructions:        Please contact your physician with any problems or Same Day Surgery at [phone], Monday  through Friday 6 am to 4 pm, or [facility] number at [phone].  

## 2021-12-06 NOTE — Op Note (Signed)
Preoperative diagnosis: Gallbladder dyskinesia   Postoperative diagnosis: Same  Procedure: Robotic Assisted Laparoscopic Cholecystectomy.   Anesthesia: GETA   Surgeon: Dr. Hazle Quant  Wound Classification: Clean Contaminated  Indications: Patient is a 61 y.o. female developed right upper quadrant pain, nausea and on workup was found to have gallbladder low ejection fraction. Robotic Assisted Laparoscopic cholecystectomy was elected.  Findings: Critical view of safety achieved Cystic duct and artery identified, ligated and divided Adequate hemostasis     Description of procedure: The patient was placed on the operating table in the supine position. General anesthesia was induced. A time-out was completed verifying correct patient, procedure, site, positioning, and implant(s) and/or special equipment prior to beginning this procedure. An orogastric tube was placed. The abdomen was prepped and draped in the usual sterile fashion.  An incision was made in a natural skin line below the umbilicus.  The fascia was elevated and the Veress needle inserted. Proper position was confirmed by aspiration and saline meniscus test.  The abdomen was insufflated with carbon dioxide to a pressure of 15 mmHg. The patient tolerated insufflation well. A 8-mm trocar was then inserted in optiview fashion.  The laparoscope was inserted and the abdomen inspected. No injuries from initial trocar placement were noted. Additional trocars were then inserted in the following locations: an 8-mm trocar in the left lateral abdomen, and another two 8-mm trocars to the right side of the abdomen 5 cm appart. The umbilical trocar was changed to a 12 mm trocar all under direct visualization. The abdomen was inspected and no abnormalities were found. The table was placed in the reverse Trendelenburg position with the right side up. The robotic arms were docked and target anatomy identified. Instrument inserted under direct  visualization.  Filmy adhesions between the gallbladder and omentum, duodenum and transverse colon were lysed with electrocautery. The dome of the gallbladder was grasped with a prograsp and retracted over the dome of the liver. The infundibulum was also grasped with an atraumatic grasper and retracted toward the right lower quadrant. This maneuver exposed Calots triangle. The peritoneum overlying the gallbladder infundibulum was then incised and the cystic duct and cystic artery identified and circumferentially dissected. Critical view of safety reviewed before ligating any structure. Firefly images taken to visualize biliary ducts. The cystic duct and cystic artery were then doubly clipped and divided close to the gallbladder.  The gallbladder was then dissected from its peritoneal attachments by electrocautery. Hemostasis was checked and the gallbladder and contained stones were removed using an endoscopic retrieval bag. The gallbladder was passed off the table as a specimen. There was no evidence of bleeding from the gallbladder fossa or cystic artery or leakage of the bile from the cystic duct stump. Secondary trocars were removed under direct vision. No bleeding was noted. The robotic arms were undoked. The scope was withdrawn and the umbilical trocar removed. The abdomen was allowed to collapse. The fascia of the 13mm trocar sites was closed with figure-of-eight 0 vicryl sutures. The skin was closed with subcuticular sutures of 4-0 monocryl and topical skin adhesive. The orogastric tube was removed.  The patient tolerated the procedure well and was taken to the postanesthesia care unit in stable condition.   Specimen: Gallbladder  Complications: None  EBL: 2 mL

## 2021-12-06 NOTE — Anesthesia Procedure Notes (Signed)
Procedure Name: Intubation Date/Time: 12/06/2021 7:42 AM Performed by: Aline Brochure, CRNA Pre-anesthesia Checklist: Patient identified, Patient being monitored, Timeout performed, Emergency Drugs available and Suction available Patient Re-evaluated:Patient Re-evaluated prior to induction Oxygen Delivery Method: Circle system utilized Preoxygenation: Pre-oxygenation with 100% oxygen Induction Type: IV induction Ventilation: Mask ventilation without difficulty Laryngoscope Size: Mac and 3 Grade View: Grade II Tube type: Oral Tube size: 7.0 mm Number of attempts: 1 Airway Equipment and Method: Stylet Placement Confirmation: ETT inserted through vocal cords under direct vision, positive ETCO2 and breath sounds checked- equal and bilateral Secured at: 22 cm Tube secured with: Tape Dental Injury: Teeth and Oropharynx as per pre-operative assessment

## 2021-12-06 NOTE — Anesthesia Postprocedure Evaluation (Signed)
Anesthesia Post Note  Patient: Laura Compton  Procedure(s) Performed: XI ROBOTIC ASSISTED LAPAROSCOPIC CHOLECYSTECTOMY (Abdomen) INDOCYANINE GREEN FLUORESCENCE IMAGING (ICG)  Patient location during evaluation: PACU Anesthesia Type: General Level of consciousness: awake and alert, oriented and patient cooperative Pain management: pain level controlled Vital Signs Assessment: post-procedure vital signs reviewed and stable Respiratory status: spontaneous breathing, nonlabored ventilation and respiratory function stable Cardiovascular status: blood pressure returned to baseline and stable Postop Assessment: adequate PO intake Anesthetic complications: no Comments: Postop nausea and vomiting effectively treated with phenergan.   No notable events documented.   Last Vitals:  Vitals:   12/06/21 0930 12/06/21 0943  BP: 129/77   Pulse: 84   Resp:    Temp:  (!) 36.1 C  SpO2: 99%     Last Pain:  Vitals:   12/06/21 0930  TempSrc:   PainSc: 0-No pain                 Reed Breech

## 2021-12-06 NOTE — Anesthesia Preprocedure Evaluation (Addendum)
Anesthesia Evaluation  Patient identified by MRN, date of birth, ID band Patient awake    Reviewed: Allergy & Precautions, NPO status , Patient's Chart, lab work & pertinent test results  History of Anesthesia Complications Negative for: history of anesthetic complications  Airway Mallampati: I   Neck ROM: Full    Dental no notable dental hx.    Pulmonary sleep apnea ,    Pulmonary exam normal breath sounds clear to auscultation       Cardiovascular Exercise Tolerance: Good Normal cardiovascular exam Rhythm:Regular Rate:Normal  Hx DVT postpartum 1999   Neuro/Psych negative neurological ROS     GI/Hepatic hiatal hernia, GERD  ,  Endo/Other  Obesity   Renal/GU negative Renal ROS     Musculoskeletal   Abdominal   Peds  Hematology negative hematology ROS (+)   Anesthesia Other Findings   Reproductive/Obstetrics                            Anesthesia Physical Anesthesia Plan  ASA: 2  Anesthesia Plan: General   Post-op Pain Management:    Induction: Intravenous  PONV Risk Score and Plan: 3 and Ondansetron, Dexamethasone and Treatment may vary due to age or medical condition  Airway Management Planned: Oral ETT  Additional Equipment:   Intra-op Plan:   Post-operative Plan: Extubation in OR  Informed Consent: I have reviewed the patients History and Physical, chart, labs and discussed the procedure including the risks, benefits and alternatives for the proposed anesthesia with the patient or authorized representative who has indicated his/her understanding and acceptance.     Dental advisory given  Plan Discussed with: CRNA  Anesthesia Plan Comments: (Patient consented for risks of anesthesia including but not limited to:  - adverse reactions to medications - damage to eyes, teeth, lips or other oral mucosa - nerve damage due to positioning  - sore throat or hoarseness -  damage to heart, brain, nerves, lungs, other parts of body or loss of life  Informed patient about role of CRNA in peri- and intra-operative care.  Patient voiced understanding.)        Anesthesia Quick Evaluation

## 2021-12-06 NOTE — Interval H&P Note (Signed)
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  The various methods of treatment have been discussed with the patient and family. After consideration of risks, benefits and other options for treatment, the patient has consented to  Procedure(s): XI ROBOTIC ASSISTED LAPAROSCOPIC CHOLECYSTECTOMY (N/A) [place] (ICG) (N/A) as a surgical intervention.  The patient's history has been reviewed, patient examined, no change in status, stable for surgery.  I have reviewed the patient's chart and labs.  Questions were answered to the patient's satisfaction.     [name]

## 2021-12-07 LAB — SURGICAL PATHOLOGY

## 2022-06-08 ENCOUNTER — Other Ambulatory Visit: Payer: Self-pay | Admitting: Internal Medicine

## 2022-06-08 DIAGNOSIS — I208 Other forms of angina pectoris: Secondary | ICD-10-CM

## 2022-06-08 DIAGNOSIS — R0602 Shortness of breath: Secondary | ICD-10-CM

## 2022-06-08 DIAGNOSIS — K219 Gastro-esophageal reflux disease without esophagitis: Secondary | ICD-10-CM

## 2022-06-25 ENCOUNTER — Ambulatory Visit
Admission: RE | Admit: 2022-06-25 | Discharge: 2022-06-25 | Disposition: A | Discharge status: Home / Self Care | Payer: Federal, State, Local not specified - PPO | Source: Ambulatory Visit | Attending: Internal Medicine | Admitting: Internal Medicine

## 2022-06-25 DIAGNOSIS — K219 Gastro-esophageal reflux disease without esophagitis: Secondary | ICD-10-CM

## 2022-06-25 DIAGNOSIS — R0602 Shortness of breath: Secondary | ICD-10-CM

## 2022-06-25 DIAGNOSIS — I208 Other forms of angina pectoris: Secondary | ICD-10-CM

## 2022-08-30 ENCOUNTER — Other Ambulatory Visit: Payer: Self-pay | Admitting: Internal Medicine

## 2022-08-30 DIAGNOSIS — R1011 Right upper quadrant pain: Secondary | ICD-10-CM

## 2022-09-06 ENCOUNTER — Ambulatory Visit
Admission: RE | Admit: 2022-09-06 | Discharge: 2022-09-06 | Disposition: A | Discharge status: Home / Self Care | Payer: Federal, State, Local not specified - PPO | Source: Ambulatory Visit | Attending: Internal Medicine | Admitting: Internal Medicine

## 2022-09-06 DIAGNOSIS — R1011 Right upper quadrant pain: Secondary | ICD-10-CM

## 2022-12-26 ENCOUNTER — Other Ambulatory Visit: Payer: Self-pay | Admitting: Internal Medicine

## 2022-12-26 DIAGNOSIS — Z1231 Encounter for screening mammogram for malignant neoplasm of breast: Secondary | ICD-10-CM

## 2022-12-27 ENCOUNTER — Ambulatory Visit
Admission: RE | Admit: 2022-12-27 | Discharge: 2022-12-27 | Disposition: A | Discharge status: Home / Self Care | Payer: Federal, State, Local not specified - PPO | Source: Ambulatory Visit | Attending: Internal Medicine | Admitting: Internal Medicine

## 2022-12-27 DIAGNOSIS — Z1231 Encounter for screening mammogram for malignant neoplasm of breast: Secondary | ICD-10-CM | POA: Diagnosis present

## 2023-01-31 ENCOUNTER — Other Ambulatory Visit: Payer: Self-pay | Admitting: Internal Medicine

## 2023-01-31 DIAGNOSIS — Z0000 Encounter for general adult medical examination without abnormal findings: Secondary | ICD-10-CM

## 2023-01-31 DIAGNOSIS — R1084 Generalized abdominal pain: Secondary | ICD-10-CM

## 2023-02-13 ENCOUNTER — Ambulatory Visit
Admission: RE | Admit: 2023-02-13 | Discharge: 2023-02-13 | Disposition: A | Discharge status: Home / Self Care | Payer: Federal, State, Local not specified - PPO | Source: Ambulatory Visit | Attending: Internal Medicine | Admitting: Internal Medicine

## 2023-02-13 DIAGNOSIS — R1084 Generalized abdominal pain: Secondary | ICD-10-CM

## 2023-02-13 DIAGNOSIS — Z0000 Encounter for general adult medical examination without abnormal findings: Secondary | ICD-10-CM

## 2023-02-13 MED ORDER — IOHEXOL 300 MG/ML  SOLN
100.0000 mL | Freq: Once | INTRAMUSCULAR | Status: AC | PRN
  Administered 2023-02-13: 100 mL via INTRAVENOUS

## 2023-08-08 IMAGING — MG MM DIGITAL DIAGNOSTIC UNILAT*L* W/ TOMO W/ CAD
6 series · 6 of 18 positions shown · non-contrast
Comparison: Previous exam(s).

CLINICAL DATA: Screening recall for a possible left breast mass.

EXAM:
DIGITAL DIAGNOSTIC UNILATERAL LEFT MAMMOGRAM WITH TOMOSYNTHESIS AND
CAD; ULTRASOUND LEFT BREAST LIMITED
TECHNIQUE: Left digital diagnostic mammography and breast tomosynthesis was
performed. The images were evaluated with computer-aided detection.;
Targeted ultrasound examination of the left breast was performed.

[L MLO synth-2D]
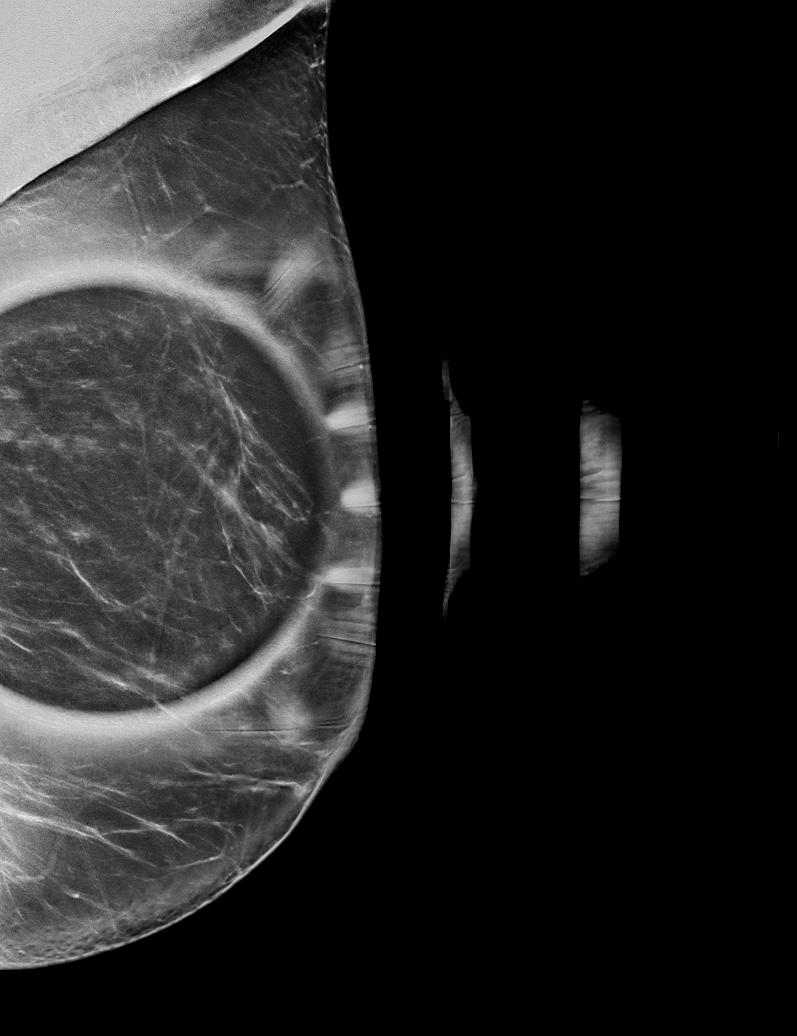

[L ML synth-2D]
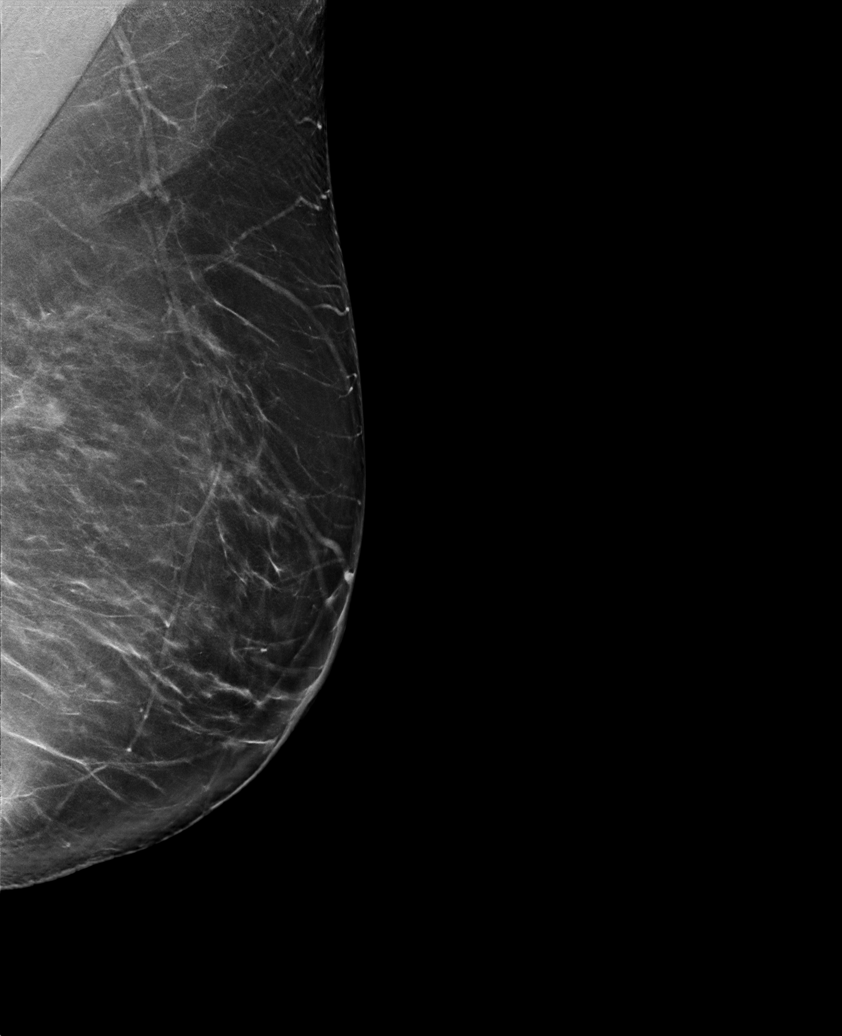

[L CC synth-2D]
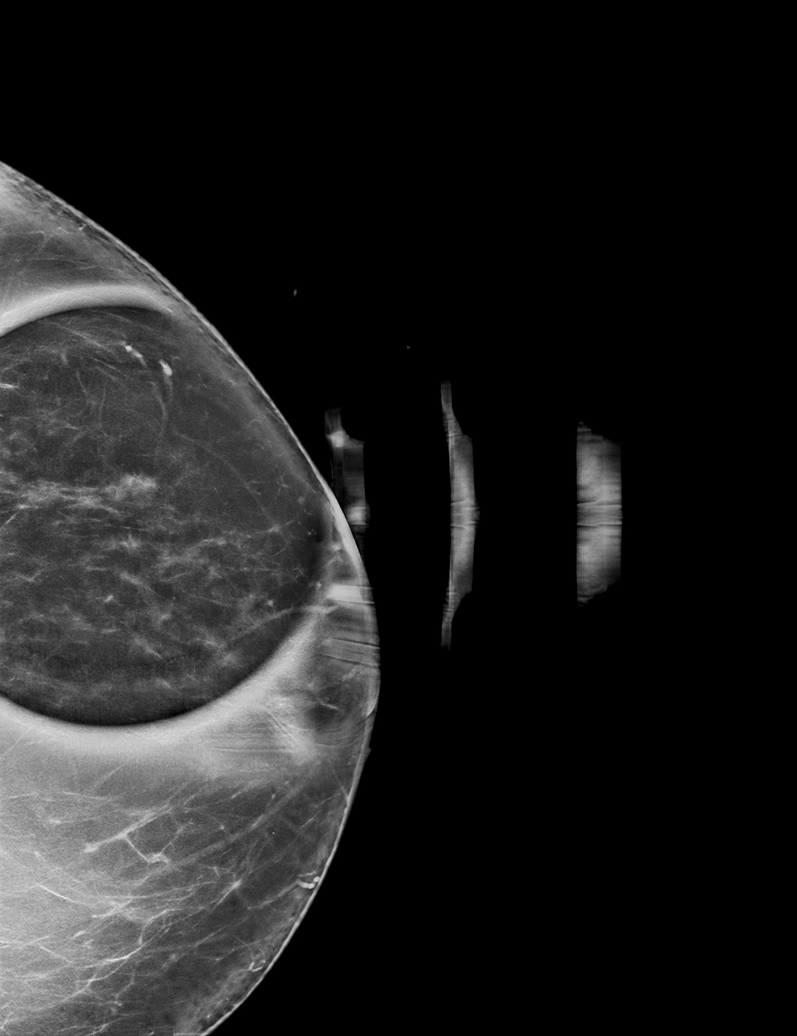

[L ML tomo · tomo slice 49/97.0]
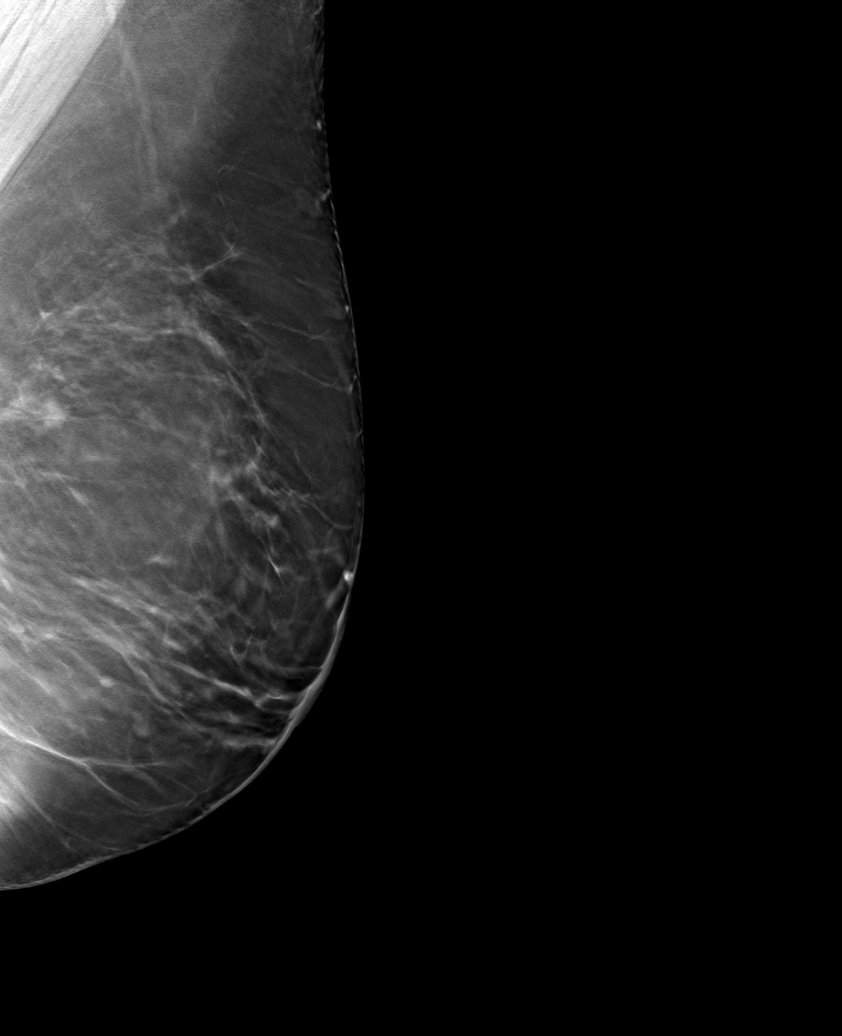

[L CC tomo · tomo slice 41/80.0]
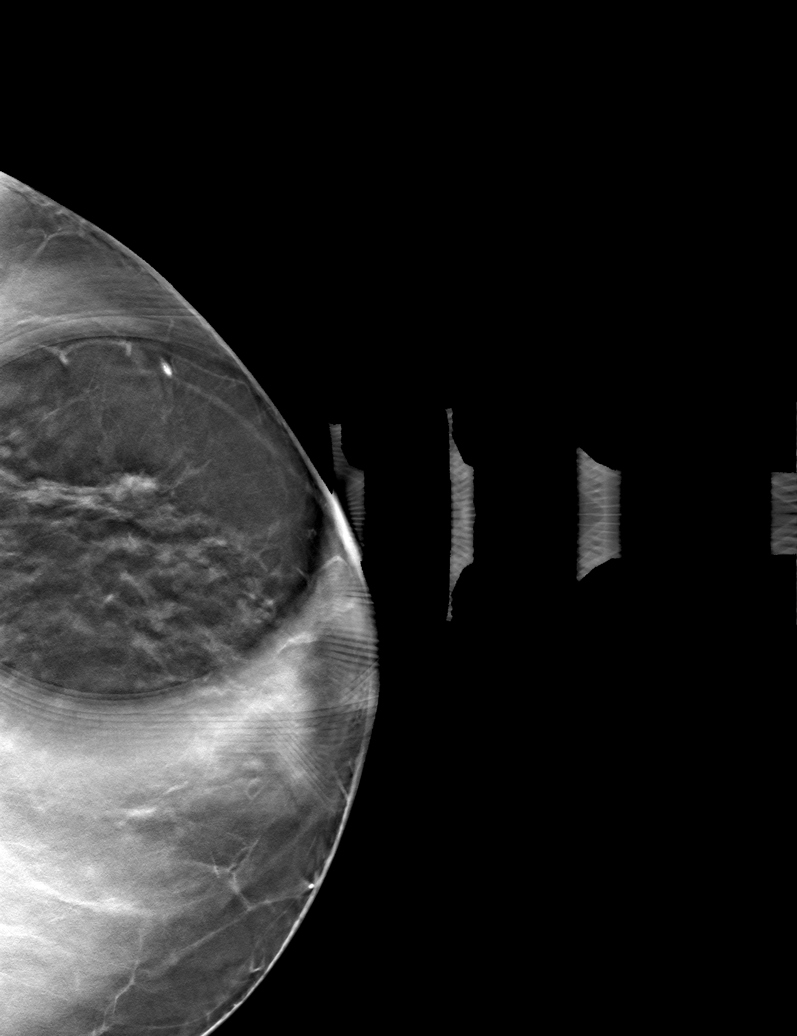

[L MLO tomo · tomo slice 43/85.0]
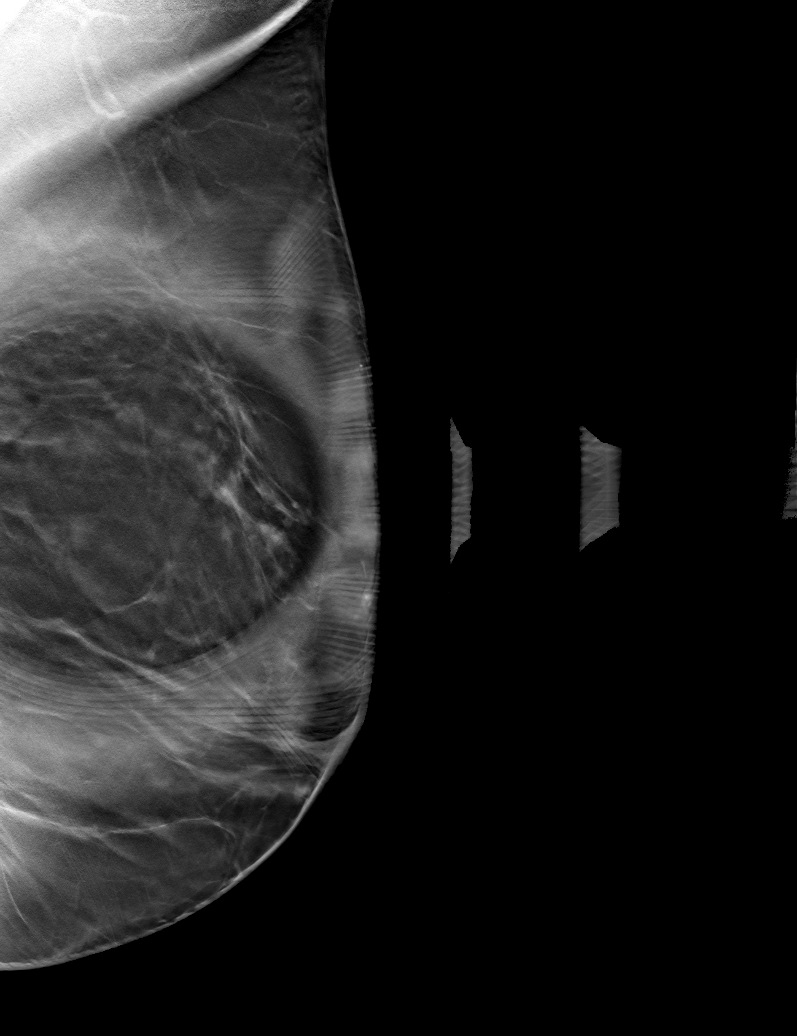

[6 of 18 positions shown; findings below may reference images not displayed]

ACR Breast Density Category b: There are scattered areas of
fibroglandular density.
FINDINGS: The possible mass, noted in the lateral left breast on the screening
exam, partly disperses on stool the spot-compression cc images and
more completely disperses on the spot-compression MLO images. No
mass or significant asymmetry is noted on the mL images.

Targeted left breast ultrasound is performed, showing a small
cluster of cysts at 2:30 o'clock, 4 cm the nipple, measuring 8 x 4 x
6 mm, which likely accounts for a portion of the mass seen on the
screening exam. There are no solid masses or suspicious lesions.
IMPRESSION: 1. No evidence of breast malignancy.
2. Small benign cluster of cysts in the left breast at 2:30 o'clock,
4 cm from the nipple.

RECOMMENDATION:
Screening mammogram in one year.(Code:5B-N-L16)

I have discussed the findings and recommendations with the patient.
If applicable, a reminder letter will be sent to the patient
regarding the next appointment.

BI-RADS CATEGORY  2: Benign.

## 2024-01-27 ENCOUNTER — Other Ambulatory Visit: Payer: Self-pay | Admitting: Internal Medicine

## 2024-01-27 DIAGNOSIS — Z1231 Encounter for screening mammogram for malignant neoplasm of breast: Secondary | ICD-10-CM

## 2024-02-06 ENCOUNTER — Ambulatory Visit
Admission: RE | Admit: 2024-02-06 | Discharge: 2024-02-06 | Disposition: A | Discharge status: Home / Self Care | Source: Ambulatory Visit | Attending: Internal Medicine | Admitting: Internal Medicine

## 2024-02-06 DIAGNOSIS — Z1231 Encounter for screening mammogram for malignant neoplasm of breast: Secondary | ICD-10-CM | POA: Diagnosis present
